# Patient Record
Sex: Female | Born: 1960 | Race: Black or African American | Hispanic: No | Marital: Married | Smoking: Never smoker
Health system: Southern US, Community
[De-identification: ages and names within clinical notes are randomized; demographics above are authoritative.]

---

## 2005-05-11 ENCOUNTER — Ambulatory Visit: Payer: Self-pay | Admitting: General Practice

## 2006-01-24 ENCOUNTER — Ambulatory Visit: Payer: Self-pay | Admitting: Internal Medicine

## 2006-04-01 ENCOUNTER — Emergency Department: Payer: Self-pay | Admitting: Emergency Medicine

## 2006-06-06 ENCOUNTER — Ambulatory Visit: Payer: Self-pay | Admitting: Internal Medicine

## 2007-01-20 ENCOUNTER — Emergency Department: Payer: Self-pay | Admitting: Unknown Physician Specialty

## 2007-06-19 ENCOUNTER — Ambulatory Visit: Payer: Self-pay | Admitting: Internal Medicine

## 2007-07-19 ENCOUNTER — Emergency Department: Payer: Self-pay | Admitting: Emergency Medicine

## 2008-07-03 ENCOUNTER — Ambulatory Visit: Payer: Self-pay | Admitting: Internal Medicine

## 2009-08-26 ENCOUNTER — Ambulatory Visit: Payer: Self-pay | Admitting: Unknown Physician Specialty

## 2009-10-23 ENCOUNTER — Ambulatory Visit: Payer: Self-pay | Admitting: Gastroenterology

## 2010-09-07 ENCOUNTER — Ambulatory Visit: Payer: Self-pay | Admitting: Unknown Physician Specialty

## 2011-04-10 ENCOUNTER — Ambulatory Visit: Payer: Self-pay

## 2011-09-29 ENCOUNTER — Ambulatory Visit: Payer: Self-pay | Admitting: Internal Medicine

## 2012-10-02 ENCOUNTER — Ambulatory Visit: Payer: Self-pay | Admitting: Physician Assistant

## 2013-10-03 ENCOUNTER — Ambulatory Visit: Payer: Self-pay | Admitting: Physician Assistant

## 2014-02-24 DIAGNOSIS — I10 Essential (primary) hypertension: Secondary | ICD-10-CM | POA: Insufficient documentation

## 2014-09-24 DIAGNOSIS — E559 Vitamin D deficiency, unspecified: Secondary | ICD-10-CM | POA: Insufficient documentation

## 2014-10-07 ENCOUNTER — Telehealth: Payer: Self-pay | Admitting: *Deleted

## 2014-10-07 NOTE — Telephone Encounter (Signed)
Patient calls stating that she needs a prescription refill on a medication Dr. Al CorpusHyatt gave her.  Patient did not leave name of medication.

## 2014-10-07 NOTE — Telephone Encounter (Signed)
Returned call - L/M to call office back with medication name. Also we needed to update her chart since it had been a few years since she was seen.

## 2014-10-09 NOTE — Telephone Encounter (Signed)
Patient is requesting meloxicam . She needs an appointment . She has not been seen a couple of years

## 2014-10-20 ENCOUNTER — Ambulatory Visit: Payer: Self-pay | Admitting: Podiatry

## 2014-10-24 ENCOUNTER — Other Ambulatory Visit: Payer: Self-pay | Admitting: Internal Medicine

## 2014-10-24 DIAGNOSIS — Z1231 Encounter for screening mammogram for malignant neoplasm of breast: Secondary | ICD-10-CM

## 2014-11-03 ENCOUNTER — Ambulatory Visit (INDEPENDENT_AMBULATORY_CARE_PROVIDER_SITE_OTHER): Payer: BLUE CROSS/BLUE SHIELD

## 2014-11-03 ENCOUNTER — Ambulatory Visit (INDEPENDENT_AMBULATORY_CARE_PROVIDER_SITE_OTHER): Payer: BLUE CROSS/BLUE SHIELD | Admitting: Podiatry

## 2014-11-03 ENCOUNTER — Encounter: Payer: Self-pay | Admitting: Podiatry

## 2014-11-03 VITALS — Ht 64.0 in | Wt 162.0 lb

## 2014-11-03 DIAGNOSIS — M722 Plantar fascial fibromatosis: Secondary | ICD-10-CM

## 2014-11-03 MED ORDER — MELOXICAM 15 MG PO TABS: 15.0000 mg | ORAL_TABLET | Freq: Every day | ORAL | Status: DC

## 2014-11-04 NOTE — Progress Notes (Signed)
She presents today with recurrence of right heel pain. She states that she ran out of her meloxicam and has since developed plantar fasciitis once again.  Objective: Vital signs are stable alert and oriented 3. Pulses are strongly palpable right foot. Pain on palpation medial calcaneal tubercle of the right foot.  Assessment: Pain in limb secondary to plantar fasciitis right.  Plan: We injected the right heel again today started her back on her meloxicam. She will continue use of her night splint. Follow up with her as needed.

## 2014-11-18 ENCOUNTER — Ambulatory Visit
Admission: RE | Admit: 2014-11-18 | Discharge: 2014-11-18 | Disposition: A | Discharge status: Home / Self Care | Payer: BLUE CROSS/BLUE SHIELD | Source: Ambulatory Visit | Attending: Internal Medicine | Admitting: Internal Medicine

## 2014-11-18 DIAGNOSIS — Z1231 Encounter for screening mammogram for malignant neoplasm of breast: Secondary | ICD-10-CM | POA: Diagnosis present

## 2015-10-27 ENCOUNTER — Other Ambulatory Visit: Payer: Self-pay | Admitting: Physician Assistant

## 2015-10-27 DIAGNOSIS — Z1231 Encounter for screening mammogram for malignant neoplasm of breast: Secondary | ICD-10-CM

## 2015-11-23 ENCOUNTER — Ambulatory Visit
Admission: RE | Admit: 2015-11-23 | Discharge: 2015-11-23 | Disposition: A | Discharge status: Home / Self Care | Payer: BLUE CROSS/BLUE SHIELD | Source: Ambulatory Visit | Attending: Physician Assistant | Admitting: Physician Assistant

## 2015-11-23 ENCOUNTER — Ambulatory Visit: Payer: BLUE CROSS/BLUE SHIELD

## 2015-11-23 DIAGNOSIS — Z1231 Encounter for screening mammogram for malignant neoplasm of breast: Secondary | ICD-10-CM | POA: Diagnosis present

## 2015-11-27 ENCOUNTER — Other Ambulatory Visit: Payer: Self-pay | Admitting: Physician Assistant

## 2015-11-27 DIAGNOSIS — R928 Other abnormal and inconclusive findings on diagnostic imaging of breast: Secondary | ICD-10-CM

## 2015-12-11 ENCOUNTER — Ambulatory Visit
Admission: RE | Admit: 2015-12-11 | Discharge: 2015-12-11 | Disposition: A | Discharge status: Home / Self Care | Payer: BLUE CROSS/BLUE SHIELD | Source: Ambulatory Visit | Attending: Physician Assistant | Admitting: Physician Assistant

## 2015-12-11 DIAGNOSIS — N6489 Other specified disorders of breast: Secondary | ICD-10-CM | POA: Diagnosis present

## 2015-12-11 DIAGNOSIS — R928 Other abnormal and inconclusive findings on diagnostic imaging of breast: Secondary | ICD-10-CM

## 2016-10-27 ENCOUNTER — Other Ambulatory Visit: Payer: Self-pay | Admitting: Physician Assistant

## 2016-10-27 DIAGNOSIS — Z1231 Encounter for screening mammogram for malignant neoplasm of breast: Secondary | ICD-10-CM

## 2016-11-28 ENCOUNTER — Ambulatory Visit: Payer: BLUE CROSS/BLUE SHIELD

## 2016-12-12 ENCOUNTER — Ambulatory Visit
Admission: RE | Admit: 2016-12-12 | Discharge: 2016-12-12 | Disposition: A | Discharge status: Home / Self Care | Payer: BLUE CROSS/BLUE SHIELD | Source: Ambulatory Visit | Attending: Physician Assistant | Admitting: Physician Assistant

## 2016-12-12 DIAGNOSIS — Z1231 Encounter for screening mammogram for malignant neoplasm of breast: Secondary | ICD-10-CM | POA: Diagnosis present

## 2016-12-20 IMAGING — MG MM DIGITAL DIAGNOSTIC UNILAT*R* W/ TOMO W/ CAD
6 series · 6 of 14 positions shown · non-contrast
Comparison: Previous exams including recent screening mammogram
dated 11/23/2015.

CLINICAL DATA: Patient returns today to evaluate a possible right
breast asymmetry questioned on recent screening mammogram.

EXAM:
2D DIGITAL DIAGNOSTIC UNILATERAL RIGHT MAMMOGRAM WITH CAD AND
ADJUNCT TOMO

[R MLO]
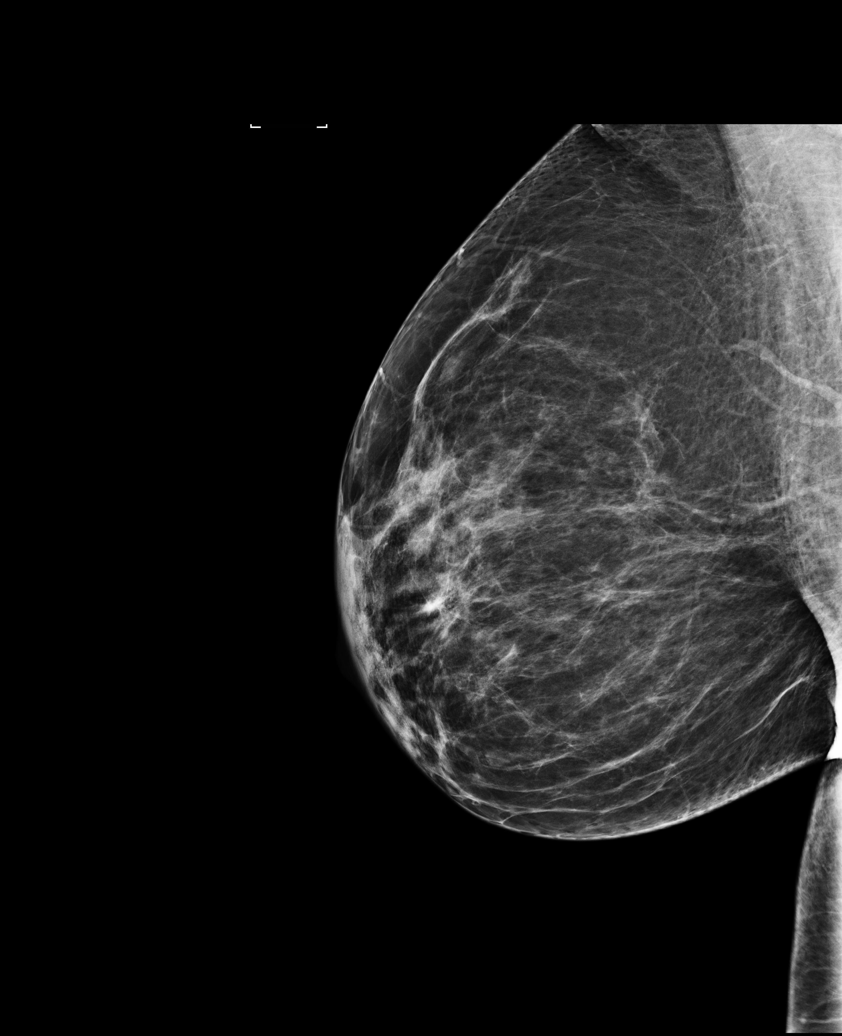

[R MLO synth-2D]
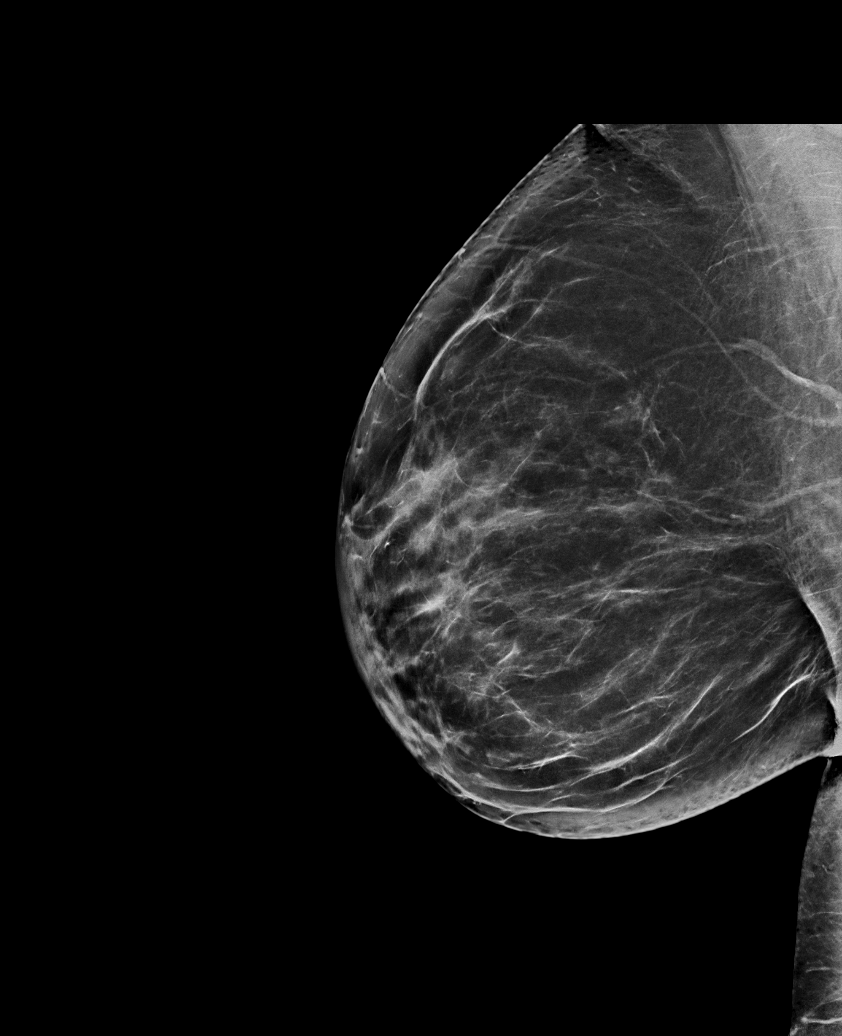

[R CC synth-2D]
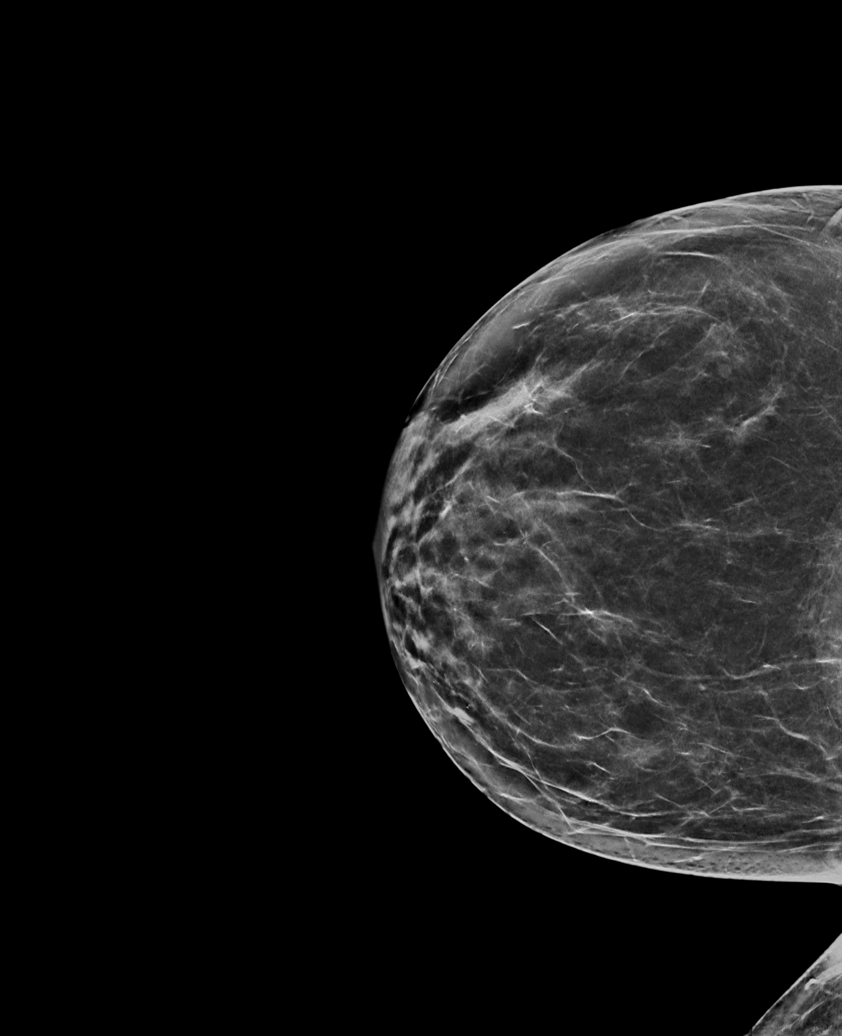

[R CC]
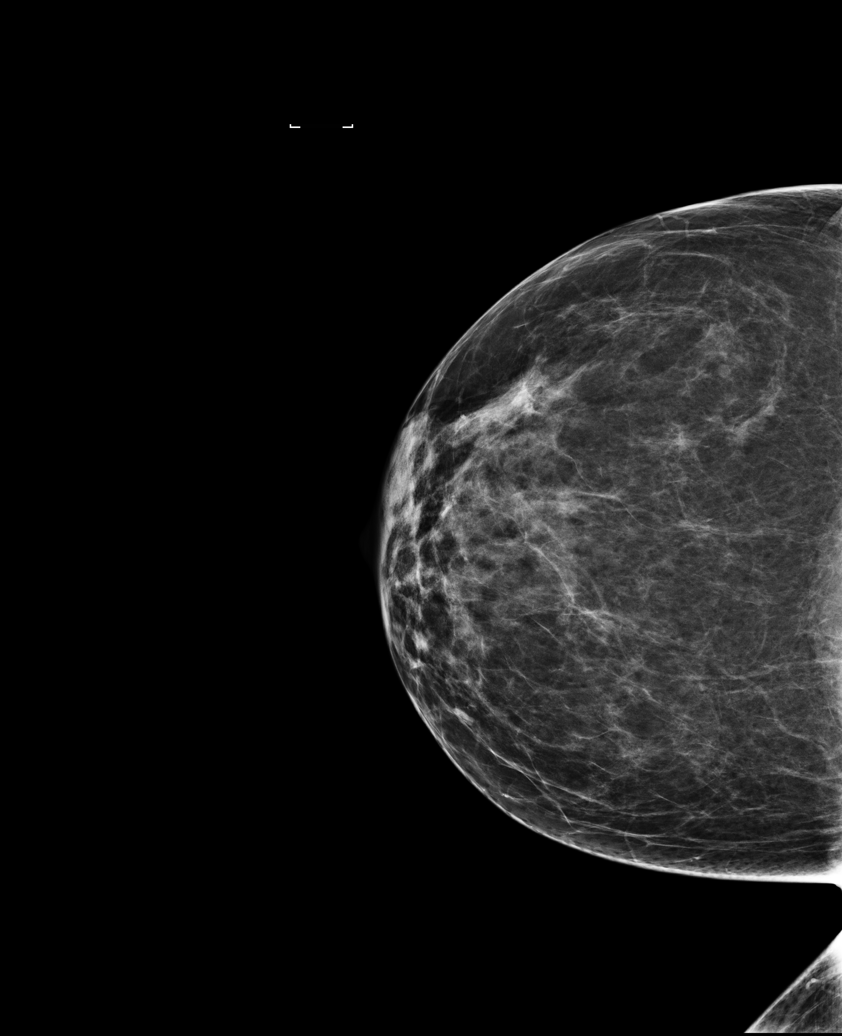

[R CC tomo · tomo slice 37/72.0]
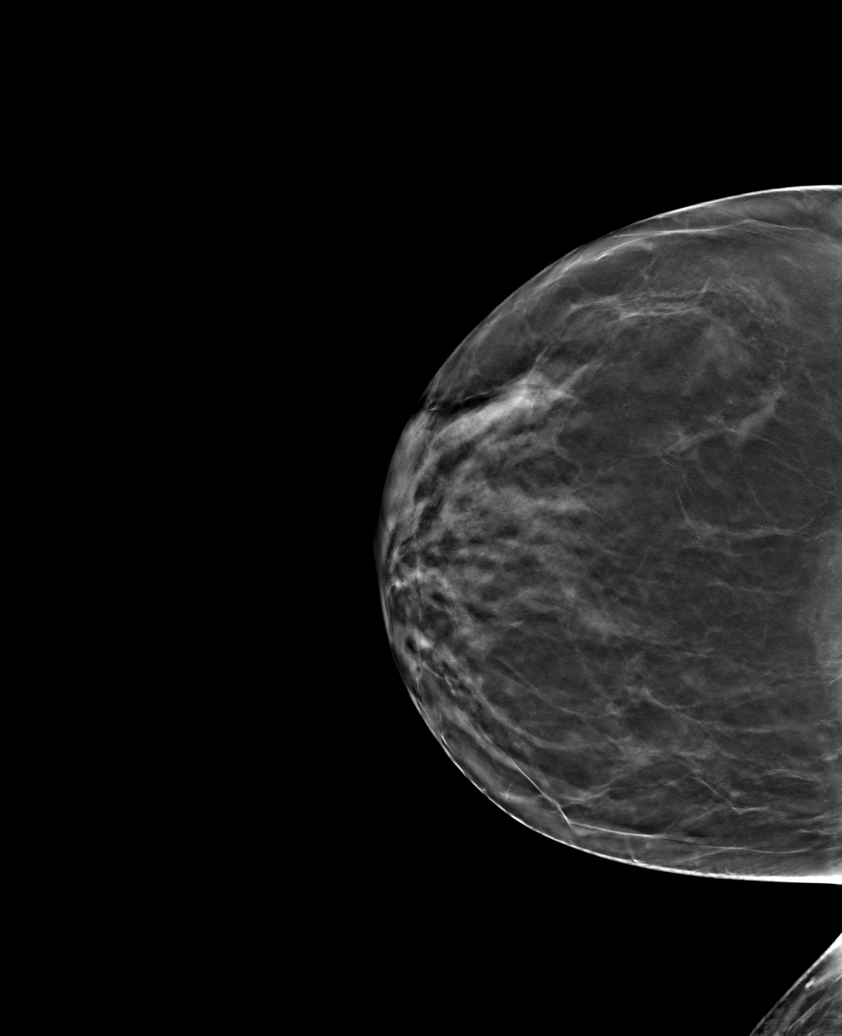

[R MLO tomo · tomo slice 41/80.0]
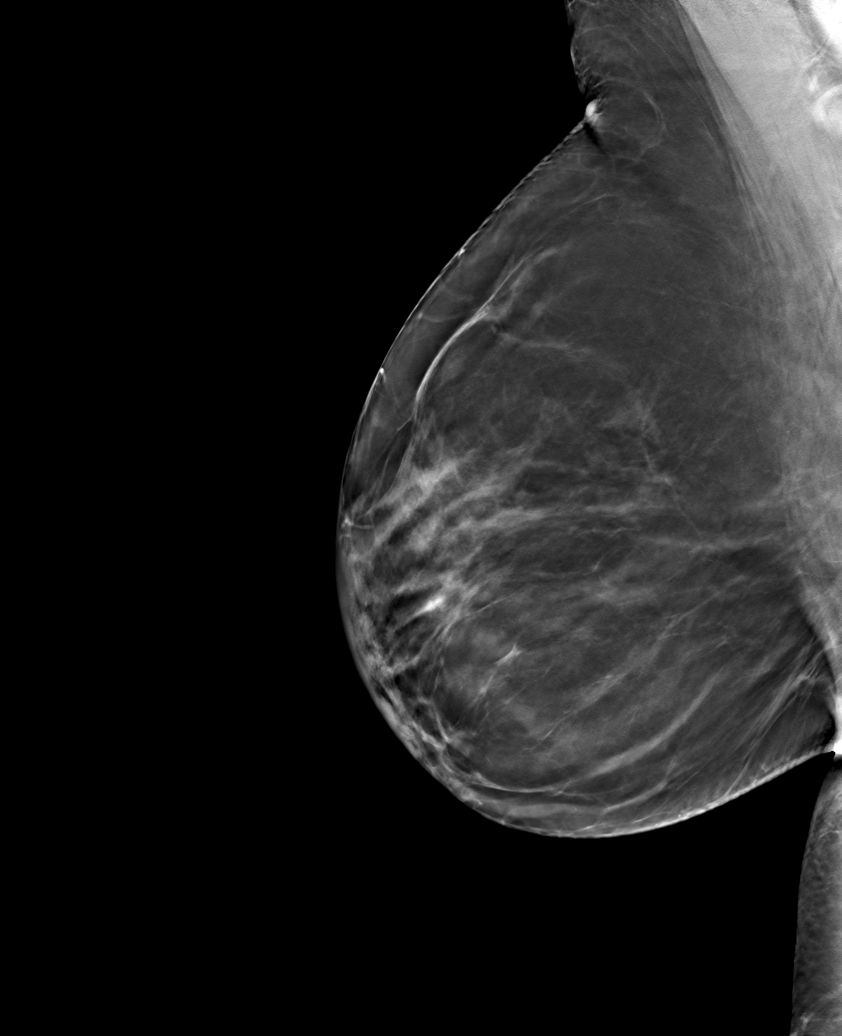

[6 of 14 positions shown; findings below may reference images not displayed]

ACR Breast Density Category b: There are scattered areas of
fibroglandular density.
FINDINGS: On today's additional views of the right breast, with 3D
tomosynthesis, there is no persistent asymmetry within the outer
right breast indicating superimposition of normal fibroglandular
tissues. No new dominant masses, suspicious calcifications or
secondary signs of malignancy within the right breast.

Mammographic images were processed with CAD.
IMPRESSION: No evidence of malignancy within the right breast.

Patient may return to routine annual bilateral screening mammogram
schedule.

RECOMMENDATION:
Screening mammogram in one year.(Code:[id number])

I have discussed the findings and recommendations with the patient.
Results were also provided in writing at the conclusion of the
visit. If applicable, a reminder letter will be sent to the patient
regarding the next appointment.

BI-RADS CATEGORY  1: Negative.

## 2017-06-07 ENCOUNTER — Ambulatory Visit: Payer: BLUE CROSS/BLUE SHIELD | Admitting: Podiatry

## 2017-06-14 ENCOUNTER — Ambulatory Visit (INDEPENDENT_AMBULATORY_CARE_PROVIDER_SITE_OTHER): Payer: BLUE CROSS/BLUE SHIELD

## 2017-06-14 ENCOUNTER — Ambulatory Visit: Payer: BLUE CROSS/BLUE SHIELD | Admitting: Podiatry

## 2017-06-14 ENCOUNTER — Encounter: Payer: Self-pay | Admitting: Podiatry

## 2017-06-14 DIAGNOSIS — M722 Plantar fascial fibromatosis: Secondary | ICD-10-CM

## 2017-06-14 DIAGNOSIS — K5909 Other constipation: Secondary | ICD-10-CM | POA: Insufficient documentation

## 2017-06-14 DIAGNOSIS — G5792 Unspecified mononeuropathy of left lower limb: Secondary | ICD-10-CM

## 2017-06-14 DIAGNOSIS — B379 Candidiasis, unspecified: Secondary | ICD-10-CM | POA: Insufficient documentation

## 2017-06-14 MED ORDER — METHYLPREDNISOLONE 4 MG PO TBPK: ORAL_TABLET | ORAL | 0 refills | Status: AC

## 2017-06-14 NOTE — Progress Notes (Signed)
She presents today chief complaint of burning in the plantar medial arch left foot.  She denies any trauma.  States that is been going on now for the past few weeks.  She states that she knows she needs to purchase new shoes from work.  Objective: Vital signs are stable she is alert and oriented x3 pulses are palpable.  She has reproducible tenderness in the medial longitudinal arch and distally beneath the first metatarsal mid diaphyseal region.  No reproducible radiating pain however.  Assessment: Probable neuritis or focal arched plantar fasciitis.  Plan: I injected the area today medial longitudinal arch just distal to the base of the proximal phalanx.  I also started her on a Medrol Dosepak.  I will follow-up with her in 1 month.

## 2017-07-17 ENCOUNTER — Ambulatory Visit: Payer: BLUE CROSS/BLUE SHIELD | Admitting: Podiatry

## 2017-11-27 ENCOUNTER — Other Ambulatory Visit: Payer: Self-pay | Admitting: Physician Assistant

## 2017-11-27 DIAGNOSIS — Z1231 Encounter for screening mammogram for malignant neoplasm of breast: Secondary | ICD-10-CM

## 2017-12-18 ENCOUNTER — Ambulatory Visit
Admission: RE | Admit: 2017-12-18 | Discharge: 2017-12-18 | Disposition: A | Discharge status: Home / Self Care | Payer: BLUE CROSS/BLUE SHIELD | Source: Ambulatory Visit | Attending: Physician Assistant | Admitting: Physician Assistant

## 2017-12-18 DIAGNOSIS — Z1231 Encounter for screening mammogram for malignant neoplasm of breast: Secondary | ICD-10-CM | POA: Diagnosis present

## 2018-09-24 ENCOUNTER — Emergency Department
Admission: EM | Admit: 2018-09-24 | Discharge: 2018-09-24 | Disposition: A | Discharge status: Home / Self Care | Payer: BLUE CROSS/BLUE SHIELD | Attending: Emergency Medicine | Admitting: Emergency Medicine

## 2018-09-24 ENCOUNTER — Emergency Department: Payer: BLUE CROSS/BLUE SHIELD

## 2018-09-24 ENCOUNTER — Encounter: Payer: Self-pay | Admitting: *Deleted

## 2018-09-24 ENCOUNTER — Other Ambulatory Visit: Payer: Self-pay

## 2018-09-24 DIAGNOSIS — I10 Essential (primary) hypertension: Secondary | ICD-10-CM | POA: Diagnosis not present

## 2018-09-24 DIAGNOSIS — Z79899 Other long term (current) drug therapy: Secondary | ICD-10-CM | POA: Insufficient documentation

## 2018-09-24 DIAGNOSIS — R079 Chest pain, unspecified: Secondary | ICD-10-CM | POA: Diagnosis present

## 2018-09-24 LAB — TROPONIN I: Troponin I: <0.03 ng/mL (ref <0.03)

## 2018-09-24 LAB — CBC
HEMATOCRIT: 41.4 % (ref 36.0–46.0)
HEMOGLOBIN: 13 g/dL (ref 12.0–15.0)
MCH: 26.4 pg (ref 26.0–34.0)
MCHC: 31.4 g/dL (ref 30.0–36.0)
MCV: 84.1 fL (ref 80.0–100.0)
Platelets: 218 10*3/uL (ref 150–400)
RBC: 4.92 MIL/uL (ref 3.87–5.11)
RDW: 13.9 % (ref 11.5–15.5)
WBC: 6.3 10*3/uL (ref 4.0–10.5)
nRBC: 0 % (ref 0.0–0.2)

## 2018-09-24 LAB — BASIC METABOLIC PANEL
ANION GAP: 7 (ref 5–15)
BUN: 14 mg/dL (ref 6–20)
CHLORIDE: 108 mmol/L (ref 98–111)
CO2: 24 mmol/L (ref 22–32)
Calcium: 9.7 mg/dL (ref 8.9–10.3)
Creatinine, Ser: 0.64 mg/dL (ref 0.44–1.00)
GFR calc Af Amer: >60 mL/min (ref >60)
GFR calc non Af Amer: >60 mL/min (ref >60)
GLUCOSE: 99 mg/dL (ref 70–99)
POTASSIUM: 4 mmol/L (ref 3.5–5.1)
Sodium: 139 mmol/L (ref 135–145)

## 2018-09-24 LAB — FIBRIN DERIVATIVES D-DIMER (ARMC ONLY): FIBRIN DERIVATIVES D-DIMER (ARMC): 219.76 ng{FEU}/mL (ref 0.00–499.00)

## 2018-09-24 MED ORDER — SODIUM CHLORIDE 0.9% FLUSH: 3.0000 mL | Freq: Once | INTRAVENOUS | Status: DC

## 2018-09-24 MED ORDER — ASPIRIN 81 MG PO CHEW
324.0000 mg | CHEWABLE_TABLET | Freq: Once | ORAL | Status: AC
  Administered 2018-09-24: 324 mg via ORAL
  Filled 2018-09-24: qty 4

## 2018-09-24 NOTE — Discharge Instructions (Addendum)

## 2018-09-24 NOTE — ED Provider Notes (Signed)
-----------------------------------------   5:09 PM on 09/24/2018 -----------------------------------------  Patient's chest tightness is now gone.  She can get up and move around some without any bubble.  2 troponins are negative.  D-dimer is negative and chest x-ray is negative.  White count is also negative.  I will let her go she will follow-up with cardiology tomorrow.  She knows to return if she is worse at all.   Arnaldo Natal, MD 09/24/18 1710

## 2018-09-24 NOTE — ED Notes (Signed)
Pt states no CP but still c/o SOB. Family at bedside.

## 2018-09-24 NOTE — ED Triage Notes (Signed)
Pt has been having CP since yesterday.  Pt describes this as a heaviness and it has been going on since Friday.  Pt has been having sob for 9 days.  Pt was seen at a walk in in [place] yesterday but they were not able to do testing so she decided to get further evaluation.

## 2018-09-24 NOTE — ED Triage Notes (Signed)
Brought from [facility] for chest pain feels like heaviness.since Friday.  Short of breath for 9 days.

## 2018-09-24 NOTE — ED Provider Notes (Signed)
[facility] Emergency Department Provider Note ____________________________________________   First MD Initiated Contact with Patient 09/24/18 1331     (approximate)  I have reviewed the triage vital signs and the nursing notes.  HISTORY  Chief Complaint Chest Pain  HPI Jaime Taylor is a 58 y.o. female with a history of hypertension presents for evaluation of chest pain. Initial onset of pain was approximately 3-6 hours ago. The patient's chest pain is described as heaviness/pressure/tightness and is worse with exertion. The patient's chest pain is middle- or left-sided, is not well-localized, is not sharp and does not radiate to the arms/jaw/neck. The patient does not complain of nausea and denies diaphoresis. The patient has a family history of coronary artery disease in a first-degree relative with onset less than age 76. The patient has no history of stroke, has no history of peripheral artery disease, has not smoked in the past 90 days, denies any history of treated diabetes, has no history of hypercholesterolemia and does not have an elevated BMI (>=30).   Patient reports for about 8 to 9 days now she has had a slight feeling of shortness of breath off-and-on, she is also intermittently experiencing a feeling of chest pressure that seems to be more apparent when she is active at work or when she is walking about doing chores at her house.  It is a pressure feeling a heaviness over the middle of her chest, radiates none.  Seems to be relieved by rest she has not taken any medications or aspirin.  She does relate that she had a slight cold about a week ago but got better  Is not positional.  No nausea vomiting no sweating.  Denies personal history of heart disease is a strong family history in her father.  Currently not having chest pain or tightness, but does continue to feel a slight feeling of shortness of breath.  Denies history of take any estrogens, no  history of blood clots, no leg swelling, no recent trauma, no travel, no cough or hemoptysis except a slight cough a week ago.  History reviewed. No pertinent past medical history.  Patient Active Problem List   Diagnosis Date Noted  . Candida infection 06/14/2017  . Chronic constipation 06/14/2017  . Vitamin D deficiency, unspecified 09/24/2014  . HTN (hypertension) 02/24/2014    History reviewed. No pertinent surgical history.  Prior to Admission medications   Medication Sig Start Date End Date Taking? Authorizing Provider  hydrochlorothiazide (MICROZIDE) 12.5 MG capsule  09/05/14   [provider]  methylPREDNISolone (MEDROL DOSEPAK) 4 MG TBPK tablet 6 day dose pack - take as directed 06/14/17   Ernestene Kiel T, DPM    Allergies Patient has no known allergies.  Family History  Problem Relation Age of Onset  . Breast cancer Mother 44    Social History Social History   Tobacco Use  . Smoking status: Never Smoker  . Smokeless tobacco: Never Used  Substance Use Topics  . Alcohol use: Not on file  . Drug use: Not on file    Review of Systems Constitutional: No fever/chills Eyes: No visual changes. ENT: No sore throat. Cardiovascular: Denies chest pain. Respiratory: Denies shortness of breath. Gastrointestinal: No abdominal pain.   Genitourinary: Negative for dysuria. Musculoskeletal: Negative for back pain. Skin: Negative for rash. Neurological: Negative for headaches, areas of focal weakness or numbness.    ____________________________________________   PHYSICAL EXAM:  VITAL SIGNS: ED Triage Vitals  Enc Vitals Group  BP 09/24/18 1309 (!) 175/55     Pulse Rate 09/24/18 1309 61     Resp 09/24/18 1309 16     Temp 09/24/18 1309 98.3 F (36.8 C)     Temp Source 09/24/18 1309 Oral     SpO2 09/24/18 1309 100 %     Weight 09/24/18 1310 162 lb (73.5 kg)     Height --      Head Circumference --      Peak Flow --      Pain Score 09/24/18 1308 6      Pain Loc --      Pain Edu? --      Excl. in GC? --     Constitutional: Alert and oriented. Well appearing and in no acute distress. Eyes: Conjunctivae are normal. Head: Atraumatic. Nose: No congestion/rhinnorhea. Mouth/Throat: Mucous membranes are moist. Neck: No stridor.  Cardiovascular: Normal rate, regular rhythm. Grossly normal heart sounds.  Good peripheral circulation. Respiratory: Normal respiratory effort.  No retractions. Lungs CTAB.  Patient speaks in full sentences comfortably with oxygen saturation 99% on room air.  Lung sounds are completely clear.  Occasionally she will take a very deep breath, but no hypoxia is noted and otherwise respiratory pattern is very normal. Gastrointestinal: Soft and nontender. No distention. Musculoskeletal: No lower extremity tenderness nor edema. Neurologic:  Normal speech and language. No gross focal neurologic deficits are appreciated.  Skin:  Skin is warm, dry and intact. No rash noted. Psychiatric: Mood and affect are normal. Speech and behavior are normal.  ____________________________________________   LABS (all labs ordered are listed, but only abnormal results are displayed)  Labs Reviewed  BASIC METABOLIC PANEL  CBC  TROPONIN I  FIBRIN DERIVATIVES D-DIMER ([facility] ONLY)  TROPONIN I  POC URINE PREG, ED   ____________________________________________  EKG  ED ECG REPORT I, Sharyn Creamer, the attending physician, personally viewed and interpreted this ECG.  Date: 09/24/2018 EKG Time: 1310 Rate: 65 Rhythm: normal sinus rhythm QRS Axis: normal Intervals: normal ST/T Wave abnormalities: normal Narrative Interpretation: no evidence of acute ischemia  ____________________________________________  RADIOLOGY  Dg Chest 2 View  Result Date: 09/24/2018 CLINICAL DATA:  Chest pain. EXAM: CHEST - 2 VIEW COMPARISON:  Chest x-ray dated April 10, 2011. FINDINGS: The heart size and mediastinal contours are within normal limits. Both  lungs are clear. EKG electrode projecting over the left upper lobe. The visualized skeletal structures are unremarkable. IMPRESSION: No active cardiopulmonary disease. Electronically Signed   By: Obie Dredge M.D.   On: 09/24/2018 14:12    Chest x-ray is reviewed by me, negative for acute ____________________________________________   PROCEDURES  Procedure(s) performed: None  Procedures  Critical Care performed: No  ____________________________________________   INITIAL IMPRESSION / ASSESSMENT AND PLAN / ED COURSE  Pertinent labs & imaging results that were available during my care of the patient were reviewed by me and considered in my medical decision making (see chart for details).   Differential diagnosis includes, but is not limited to, ACS, aortic dissection, pulmonary embolism, cardiac tamponade, pneumothorax, pneumonia, pericarditis, myocarditis, GI-related causes including esophagitis/gastritis, and musculoskeletal chest wall pain.    The patient's heart pathway score is low risk; she has been having symptoms intermittently for over a week now and her first troponin is normal.  And find it unlikely this represents acute ACS.  Additionally, her Wells score is low risk for PE, will screen with d-dimer.  Lung sounds are clear and her respiratory pattern normal.  She has no  known history of coronary disease.  No associated neurologic symptomatology with very reassuring clinical exam at this time.  Clinical Course as of Sep 24 1503  Mon Sep 24, 2018  1458 Case and care discussed with Dr. Lady Gary,    [MQ]  [phone] Patient remains pain-free at this time.  Discussed case clinical history EKG lab work with Dr. Lady Gary.  He advises that if second troponin is negative patient may see him in clinic tomorrow at 10 AM.  Discussed with the patient and she is very agreeable with this, can see Dr. Lady Gary tomorrow at 10 AM.   [MQ]    Clinical Course User Index [MQ] Sharyn Creamer, MD    ----------------------------------------- 3:06 PM on 09/24/2018 -----------------------------------------  Ongoing care assigned to Dr. Darnelle Catalan, follow-up on second troponin and reassessment.  Patient remains well and second troponin is negative anticipate discharge to follow-up with cardiology tomorrow at 10 AM  ____________________________________________   FINAL CLINICAL IMPRESSION(S) / ED DIAGNOSES  Final diagnoses:  Chest pain with low risk for cardiac etiology        Note:  This document was prepared using Dragon voice recognition software and may include unintentional dictation errors       Sharyn Creamer, MD 09/24/18 1506

## 2019-01-23 ENCOUNTER — Other Ambulatory Visit: Payer: Self-pay | Admitting: Physician Assistant

## 2019-01-23 DIAGNOSIS — Z1231 Encounter for screening mammogram for malignant neoplasm of breast: Secondary | ICD-10-CM

## 2019-01-31 ENCOUNTER — Other Ambulatory Visit: Payer: Self-pay

## 2019-01-31 ENCOUNTER — Encounter (INDEPENDENT_AMBULATORY_CARE_PROVIDER_SITE_OTHER): Payer: Self-pay

## 2019-01-31 ENCOUNTER — Ambulatory Visit
Admission: RE | Admit: 2019-01-31 | Discharge: 2019-01-31 | Disposition: A | Discharge status: Home / Self Care | Payer: BC Managed Care – PPO | Source: Ambulatory Visit | Attending: Physician Assistant | Admitting: Physician Assistant

## 2019-01-31 DIAGNOSIS — Z1231 Encounter for screening mammogram for malignant neoplasm of breast: Secondary | ICD-10-CM | POA: Diagnosis not present

## 2020-01-30 ENCOUNTER — Other Ambulatory Visit: Payer: Self-pay | Admitting: Physician Assistant

## 2020-01-30 DIAGNOSIS — Z1231 Encounter for screening mammogram for malignant neoplasm of breast: Secondary | ICD-10-CM

## 2020-02-06 ENCOUNTER — Ambulatory Visit
Admission: RE | Admit: 2020-02-06 | Discharge: 2020-02-06 | Disposition: A | Discharge status: Home / Self Care | Payer: BC Managed Care – PPO | Source: Ambulatory Visit | Attending: Physician Assistant | Admitting: Physician Assistant

## 2020-02-06 ENCOUNTER — Other Ambulatory Visit: Payer: Self-pay

## 2020-02-06 DIAGNOSIS — Z1231 Encounter for screening mammogram for malignant neoplasm of breast: Secondary | ICD-10-CM | POA: Insufficient documentation

## 2021-01-13 ENCOUNTER — Other Ambulatory Visit: Payer: Self-pay | Admitting: Physician Assistant

## 2021-01-13 DIAGNOSIS — Z1231 Encounter for screening mammogram for malignant neoplasm of breast: Secondary | ICD-10-CM

## 2021-02-24 ENCOUNTER — Ambulatory Visit
Admission: RE | Admit: 2021-02-24 | Discharge: 2021-02-24 | Disposition: A | Discharge status: Home / Self Care | Payer: BC Managed Care – PPO | Source: Ambulatory Visit | Attending: Physician Assistant | Admitting: Physician Assistant

## 2021-02-24 ENCOUNTER — Other Ambulatory Visit: Payer: Self-pay

## 2021-02-24 DIAGNOSIS — Z1231 Encounter for screening mammogram for malignant neoplasm of breast: Secondary | ICD-10-CM | POA: Insufficient documentation

## 2022-03-24 ENCOUNTER — Other Ambulatory Visit: Payer: Self-pay | Admitting: Physician Assistant

## 2022-03-24 DIAGNOSIS — Z1231 Encounter for screening mammogram for malignant neoplasm of breast: Secondary | ICD-10-CM

## 2022-04-04 ENCOUNTER — Ambulatory Visit
Admission: RE | Admit: 2022-04-04 | Discharge: 2022-04-04 | Disposition: A | Discharge status: Home / Self Care | Payer: BC Managed Care – PPO | Source: Ambulatory Visit | Attending: Physician Assistant | Admitting: Physician Assistant

## 2022-04-04 DIAGNOSIS — Z1231 Encounter for screening mammogram for malignant neoplasm of breast: Secondary | ICD-10-CM

## 2022-04-06 ENCOUNTER — Ambulatory Visit: Payer: BC Managed Care – PPO

## 2023-03-09 ENCOUNTER — Other Ambulatory Visit: Payer: Self-pay | Admitting: Physician Assistant

## 2023-03-09 DIAGNOSIS — Z1231 Encounter for screening mammogram for malignant neoplasm of breast: Secondary | ICD-10-CM

## 2023-04-04 ENCOUNTER — Ambulatory Visit
Admission: RE | Admit: 2023-04-04 | Discharge: 2023-04-04 | Disposition: A | Discharge status: Home / Self Care | Payer: BC Managed Care – PPO | Source: Ambulatory Visit | Attending: Physician Assistant | Admitting: Physician Assistant

## 2023-04-04 ENCOUNTER — Other Ambulatory Visit: Payer: Self-pay | Admitting: Physician Assistant

## 2023-04-04 DIAGNOSIS — R1032 Left lower quadrant pain: Secondary | ICD-10-CM

## 2023-04-04 MED ORDER — IOHEXOL 300 MG/ML  SOLN
100.0000 mL | Freq: Once | INTRAMUSCULAR | Status: AC | PRN
  Administered 2023-04-04: 100 mL via INTRAVENOUS

## 2023-04-06 ENCOUNTER — Ambulatory Visit
Admission: RE | Admit: 2023-04-06 | Discharge: 2023-04-06 | Disposition: A | Discharge status: Home / Self Care | Payer: BC Managed Care – PPO | Source: Ambulatory Visit | Attending: Physician Assistant | Admitting: Physician Assistant

## 2023-04-06 DIAGNOSIS — Z1231 Encounter for screening mammogram for malignant neoplasm of breast: Secondary | ICD-10-CM | POA: Insufficient documentation

## 2024-03-13 ENCOUNTER — Other Ambulatory Visit: Payer: Self-pay | Admitting: Physician Assistant

## 2024-03-13 DIAGNOSIS — Z1231 Encounter for screening mammogram for malignant neoplasm of breast: Secondary | ICD-10-CM

## 2024-04-15 ENCOUNTER — Ambulatory Visit
Admission: RE | Admit: 2024-04-15 | Discharge: 2024-04-15 | Disposition: A | Discharge status: Home / Self Care | Source: Ambulatory Visit | Attending: Physician Assistant | Admitting: Physician Assistant

## 2024-04-15 DIAGNOSIS — Z1231 Encounter for screening mammogram for malignant neoplasm of breast: Secondary | ICD-10-CM | POA: Diagnosis present

## 2024-05-22 DIAGNOSIS — R531 Weakness: Secondary | ICD-10-CM | POA: Diagnosis not present

## 2024-05-22 DIAGNOSIS — Z03818 Encounter for observation for suspected exposure to other biological agents ruled out: Secondary | ICD-10-CM | POA: Diagnosis not present

## 2024-05-22 DIAGNOSIS — R519 Headache, unspecified: Secondary | ICD-10-CM | POA: Diagnosis not present

## 2024-05-22 DIAGNOSIS — I10 Essential (primary) hypertension: Secondary | ICD-10-CM | POA: Diagnosis not present

## 2024-05-29 DIAGNOSIS — R531 Weakness: Secondary | ICD-10-CM | POA: Diagnosis not present
